# Patient Record
Sex: Female | Born: 1945 | Race: White | Hispanic: No | Marital: Single
Health system: Midwestern US, Academic
[De-identification: ages and names within clinical notes are randomized; demographics above are authoritative.]

---

## 2018-04-05 ENCOUNTER — Encounter: Admit: 2018-04-05 | Discharge: 2018-04-05 | Payer: MEDICARE

## 2018-04-05 DIAGNOSIS — L578 Other skin changes due to chronic exposure to nonionizing radiation: Secondary | ICD-10-CM

## 2018-04-05 MED ORDER — IMIQUIMOD 5 % TP CRPK
TOPICAL | 3 refills | Status: AC
Start: 2018-04-05 — End: ?

## 2018-04-06 ENCOUNTER — Ambulatory Visit: Admit: 2018-04-05 | Discharge: 2018-04-06 | Payer: MEDICARE

## 2018-04-06 DIAGNOSIS — D229 Melanocytic nevi, unspecified: Secondary | ICD-10-CM

## 2018-04-06 DIAGNOSIS — L570 Actinic keratosis: Principal | ICD-10-CM

## 2018-04-29 ENCOUNTER — Encounter: Admit: 2018-04-29 | Discharge: 2018-04-29 | Payer: MEDICARE

## 2018-04-29 ENCOUNTER — Ambulatory Visit: Admit: 2018-04-29 | Discharge: 2018-04-30 | Payer: MEDICARE

## 2018-04-29 DIAGNOSIS — R609 Edema, unspecified: Principal | ICD-10-CM

## 2018-08-09 ENCOUNTER — Encounter: Admit: 2018-08-09 | Discharge: 2018-08-09 | Payer: MEDICARE

## 2018-08-25 ENCOUNTER — Encounter: Admit: 2018-08-25 | Discharge: 2018-08-25 | Payer: MEDICARE

## 2020-01-01 IMAGING — CR UP_EXM
2 series · 2 of 2 positions shown · non-contrast
Comparison: none

[wrist pa]
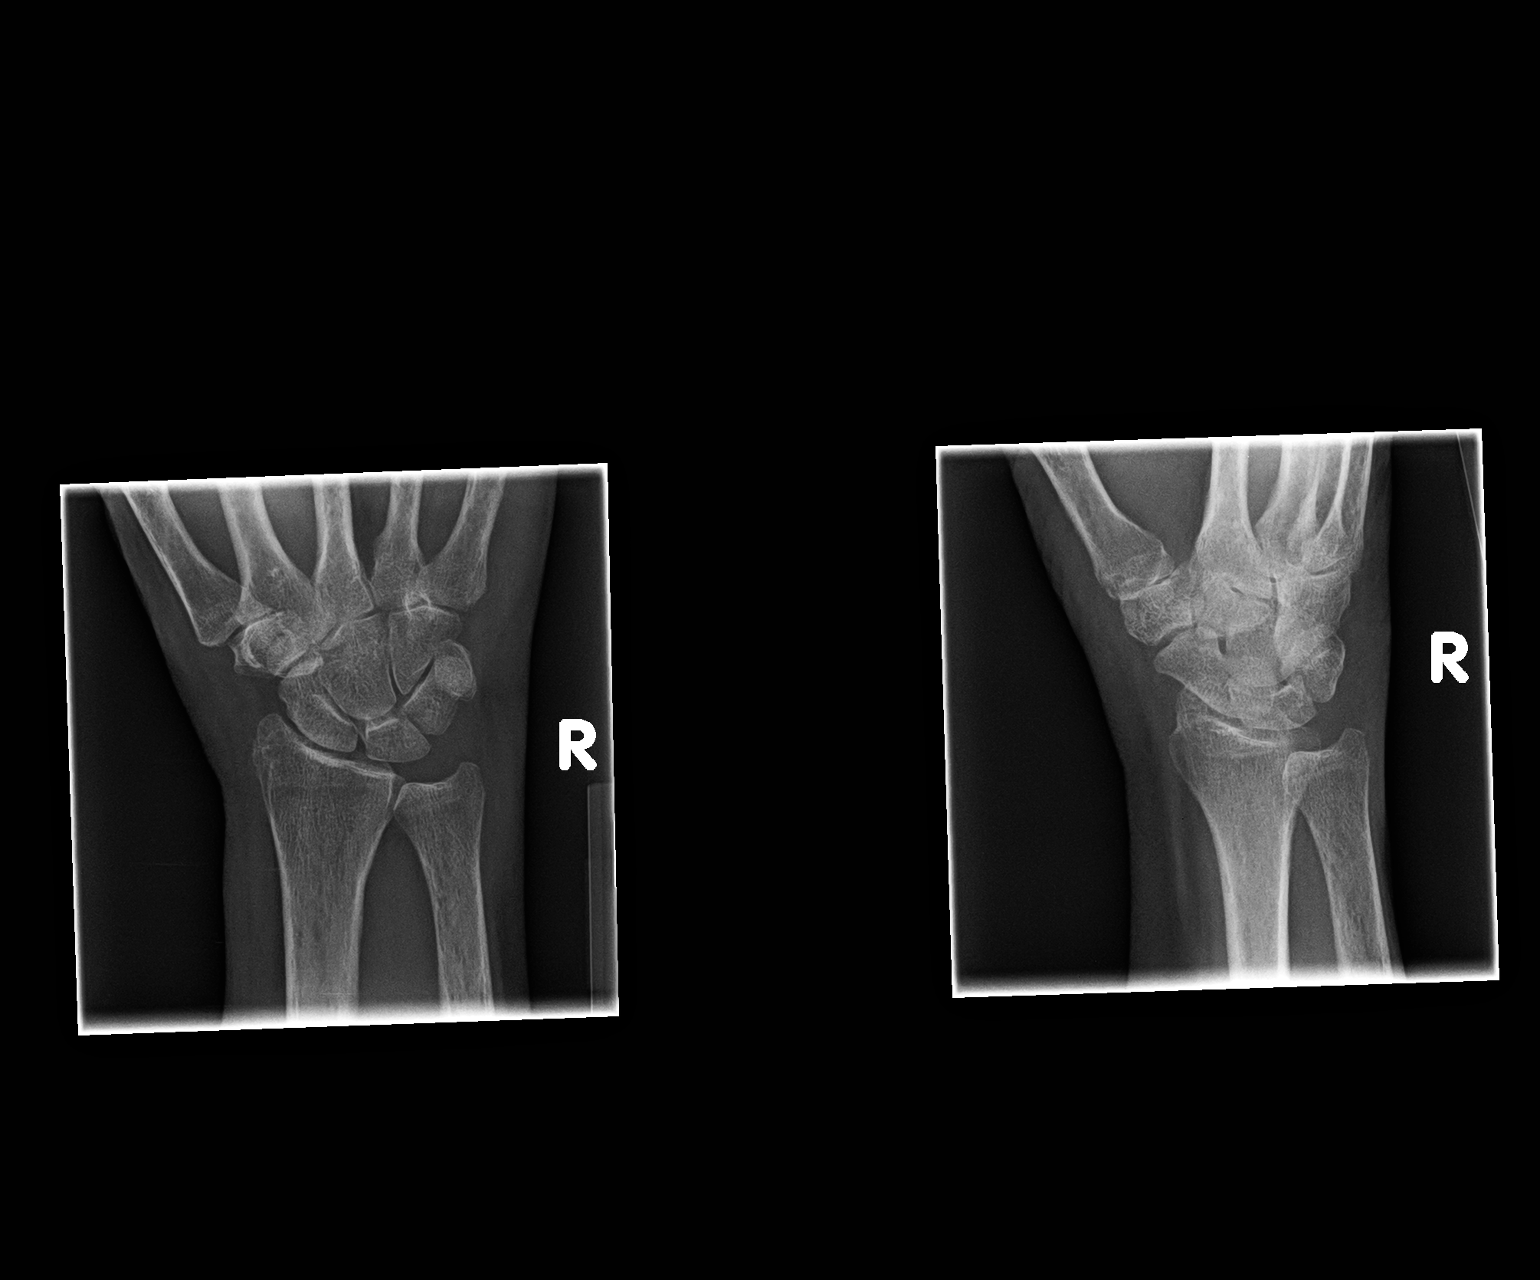

[wrist lat]
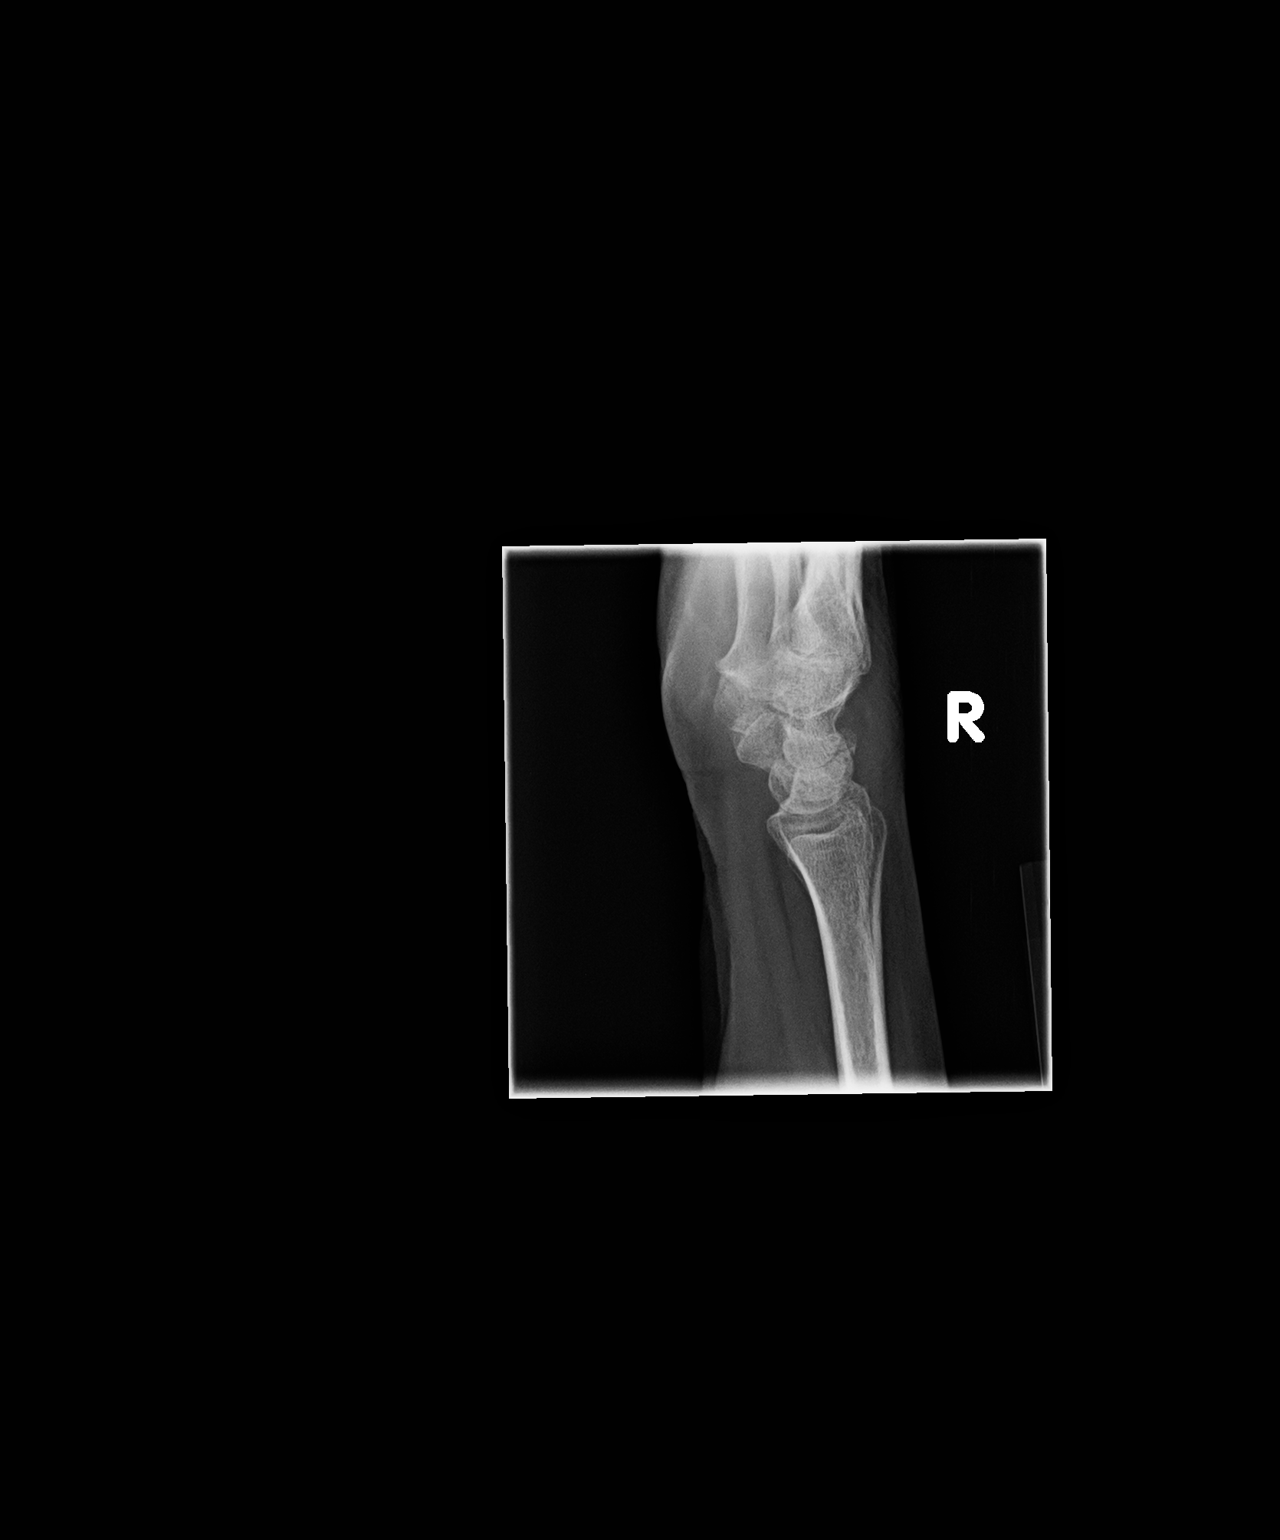

[2 of 2 positions shown; findings below may reference images not displayed]

EXAM

XR wrist RT min 3V

INDICATION

Wrist pain

TECHNIQUE

3 views of the right hand

COMPARISONS

None available at the time of dictation.

FINDINGS

No radiographic evidence of osseous malalignment or an aggressive focal osseous lesion.

There is an avulsion fracture at the dorsal aspect of the wrist compatible with a triquetral
avulsion fracture. Mild surrounding dorsal soft tissue swelling.

IMPRESSION
- Triquetral avulsion fracture.

Tech Notes:

Pt c/o pain, bruising,   swelling  on lateral and anterior sides. Fall injury this AM. JC/TJ

## 2020-09-06 ENCOUNTER — Encounter: Admit: 2020-09-06 | Discharge: 2020-09-06 | Payer: MEDICARE

## 2020-09-06 ENCOUNTER — Ambulatory Visit: Admit: 2020-09-06 | Discharge: 2020-09-07 | Payer: MEDICARE

## 2020-09-06 DIAGNOSIS — D489 Neoplasm of uncertain behavior, unspecified: Secondary | ICD-10-CM

## 2020-09-06 DIAGNOSIS — D229 Melanocytic nevi, unspecified: Secondary | ICD-10-CM

## 2020-09-06 MED ORDER — IMIQUIMOD 5 % TP CRPK
PACK | TOPICAL | 3 refills | Status: AC
Start: 2020-09-06 — End: ?

## 2020-09-06 NOTE — Progress Notes
ATTESTATION    I personally performed the key portions of the E/M visit, discussed case with resident and concur with resident documentation of history, physical exam, assessment, and treatment plan unless otherwise noted.    I performed the key components of the tangential (shave) biopsy or biopsies including site identification, discussion with patient, biopsy type and choice, and was present during the procedure.     Staff name:  Devona Konig, MD Date:  09/06/2020

## 2020-09-07 DIAGNOSIS — L570 Actinic keratosis: Secondary | ICD-10-CM

## 2020-11-28 ENCOUNTER — Encounter: Admit: 2020-11-28 | Discharge: 2020-11-28 | Payer: MEDICARE

## 2021-03-04 ENCOUNTER — Encounter: Admit: 2021-03-04 | Discharge: 2021-03-04 | Payer: MEDICARE

## 2021-03-04 ENCOUNTER — Ambulatory Visit: Admit: 2021-03-04 | Discharge: 2021-03-04 | Payer: MEDICARE

## 2021-03-04 DIAGNOSIS — L821 Other seborrheic keratosis: Secondary | ICD-10-CM

## 2021-03-04 DIAGNOSIS — L578 Other skin changes due to chronic exposure to nonionizing radiation: Secondary | ICD-10-CM

## 2021-03-04 DIAGNOSIS — L570 Actinic keratosis: Principal | ICD-10-CM

## 2021-03-04 DIAGNOSIS — D229 Melanocytic nevi, unspecified: Secondary | ICD-10-CM

## 2021-03-04 DIAGNOSIS — D1801 Hemangioma of skin and subcutaneous tissue: Secondary | ICD-10-CM

## 2021-03-04 MED ORDER — FLUOROURACIL 5 % TP CREA
Freq: Two times a day (BID) | TOPICAL | 3 refills | 24.00000 days | Status: AC
Start: 2021-03-04 — End: ?

## 2021-03-04 NOTE — Progress Notes
ATTESTATION    I personally performed the key portions of the E/M visit, discussed case with resident and concur with resident documentation of history, physical exam, assessment, and treatment plan unless otherwise noted.    I performed cryotherapy today: liquid nitrogen was applied for 10-12 seconds to the skin lesions and the expected blistering or scabbing reaction explained. Pt instructed not to pick at the area(s) and that hypopigmented scars may result from the procedure. Return if lesion fails to fully resolve. Patient tolerated procedure well, no complications.     Staff name:  Devona Konig, MD Date:  03/04/2021

## 2021-03-04 NOTE — Patient Instructions
Actinic Keratoses (AKs)    AKs are common skin growths. In fact, these growths are so common that the treatment for AKs is one of the most frequent reasons that people see a dermatologist.    Also known as ?solar keratoses? because they are caused by years of sun exposure  AKs are considered precancerous.  Left untreated, AKs may turn into squamous cell carcinoma.  AKs develop when UV light damages cells in the skin.  Cells damaged by UV light from the sun or indoor tanning cause the skin to become rough and scaly.      What do AKs look like?  Most AKs share common qualities such as being dry, scaly, and rough textured.  Not all AKs look the same.    Some are skin colored and may be easier to feel than see.  These AKs often feel like sandpaper, can appear in groups and cover larger areas of skin.    Others appear as red bumps; thick red scaly patches or growths; or crusted growths varying in color from red to brown to yellowish black.   Sometimes an AK grows rapidly upward, and you see a growth that that resembles the horn of an animal.  This is called a ?cutaneous horn?.  AKs often seem to disappear for weeks or months and then return.  This makes treatment important.  Left untreated, the damaged cells can continue to grow and skin cancer may develop.    Who gets AKs?  Fair skinned people have a higher risk of getting AKs  Also people with one or more of the following: Blonde or red hair; Blue, green or hazel eyes; Skin that freckles or burns when in the sun; 40 years or older.  People who have had a lot of sun exposure can develop AKs earlier than age 40.  Using a tanning bed or sun lamp also increases your risk.    Where do AKs form on the body?  When AKs develop, they tend to appear on skin that receives the most sun: face, forehead, and scalp, especially a bald scalp; Ears; neck and upper chest; back; arms and hands; lower legs, especially in women.  AKs also commonly form on or at the border of the lip.  An AK on the lip is known as ?actinic cheilitis? and looks like a scaly patch on a dry, often cracked lip.      Treatment  Cryosurgery/Freezing: This is the most common treatment for AKs.  Freezing the AK causes the skin to flake off.  New healthy skin forms as the treated area heals.  Chemotherapy for the skin: 5-fluorouracil is a cancer-fighting cream that you apply to the AK to destroy it.  Immunotherapy for the skin: Imiquimod cream and ingenol mebutate gel are medications that work with the body?s immune system to help destroy AKs.  NSAID for the skin: Sodium diclofenac gel is a medication that destroys AKs.  Photodynamic therapy: This treatment involves applying light-sensitive solution to the skin.  When the skin with the solution is exposed to a special light, this destroys the AK.  Chemical Peeling: A chemical solution is applied to the skin in order to peel away the AKs.  Laser skin resurfacing: A laser is used to remove the AKs.  Curretage:  This treatment involves the dermatologist carefully removing a visible AK with an instrument called a curette.  No single therapy works on all AKs or in all individuals.    How do I prevent   AKs?  Protecting your skin from the sun and other sources of UV light such as tanning beds is important.  This helps prevent new AKs and reduces the risk of AKs returning after treatment  Seek shade when appropriate, remembering that the sun?s rays are strongest between 10am and 2pm.   Wear protective clothing, such as a long-sleeved shirt, pants, a wide-brimmed hat and sunglasses, where possible.  Use a broad-spectrum sunscreen (both UVA and UVB protection) with an SPF of 30 or higher that offers water-resistance.   Reapply sunscreen approximately every 2 hours, after swimming or sweating, even on cloudy days.  Do not use tanning beds or other indoor tanning devices.  Check your skin often.  Contact us if you notice a growth on your skin that: starts to itch or bleed, becomes noticeably thicker, remains after treatment, changes in size, shape or color.

## 2021-06-21 ENCOUNTER — Encounter: Admit: 2021-06-21 | Discharge: 2021-06-21 | Payer: MEDICARE

## 2021-08-31 ENCOUNTER — Encounter: Admit: 2021-08-31 | Discharge: 2021-08-31 | Payer: MEDICARE

## 2021-08-31 ENCOUNTER — Ambulatory Visit: Admit: 2021-08-31 | Discharge: 2021-09-01 | Payer: MEDICARE

## 2021-08-31 DIAGNOSIS — D229 Melanocytic nevi, unspecified: Secondary | ICD-10-CM

## 2021-08-31 DIAGNOSIS — D1801 Hemangioma of skin and subcutaneous tissue: Secondary | ICD-10-CM

## 2021-08-31 DIAGNOSIS — L821 Other seborrheic keratosis: Secondary | ICD-10-CM

## 2021-08-31 DIAGNOSIS — L570 Actinic keratosis: Secondary | ICD-10-CM

## 2021-08-31 NOTE — Progress Notes
ATTESTATION    I personally performed the history and physical examination of the patient and discussed his management with the resident. I reviewed resident documentation of history, physical exam, assessment and treatment plan unless otherwise noted.     I performed cryotherapy today: liquid nitrogen was applied for 10-12 seconds to the skin lesions and the expected blistering or scabbing reaction explained. Pt instructed not to pick at the area(s) and that hypopigmented scars may result from the procedure. Return if lesion fails to fully resolve. Patient tolerated procedure well, no complications.         Staff name:  Devona Konig, MD Date:  08/31/2021

## 2022-09-11 ENCOUNTER — Encounter: Admit: 2022-09-11 | Discharge: 2022-09-11 | Payer: MEDICARE

## 2022-09-18 ENCOUNTER — Encounter: Admit: 2022-09-18 | Discharge: 2022-09-18 | Payer: MEDICARE

## 2022-11-30 ENCOUNTER — Encounter: Admit: 2022-11-30 | Discharge: 2022-11-30 | Payer: MEDICARE

## 2022-12-01 ENCOUNTER — Encounter: Admit: 2022-12-01 | Discharge: 2022-12-01 | Payer: MEDICARE

## 2022-12-04 ENCOUNTER — Encounter: Admit: 2022-12-04 | Discharge: 2022-12-04 | Payer: MEDICARE

## 2022-12-04 ENCOUNTER — Ambulatory Visit: Admit: 2022-12-04 | Discharge: 2022-12-05 | Payer: MEDICARE

## 2022-12-04 DIAGNOSIS — L821 Other seborrheic keratosis: Secondary | ICD-10-CM

## 2022-12-04 DIAGNOSIS — L309 Dermatitis, unspecified: Secondary | ICD-10-CM

## 2022-12-04 DIAGNOSIS — L578 Other skin changes due to chronic exposure to nonionizing radiation: Secondary | ICD-10-CM

## 2022-12-04 DIAGNOSIS — L570 Actinic keratosis: Secondary | ICD-10-CM

## 2022-12-04 DIAGNOSIS — D1801 Hemangioma of skin and subcutaneous tissue: Secondary | ICD-10-CM

## 2022-12-04 DIAGNOSIS — D229 Melanocytic nevi, unspecified: Secondary | ICD-10-CM

## 2022-12-04 MED ORDER — CLOBETASOL 0.05 % TP OINT
Freq: Two times a day (BID) | TOPICAL | 3 refills | Status: AC
Start: 2022-12-04 — End: ?

## 2022-12-04 NOTE — Progress Notes
ATTESTATION    I personally performed the history and physical examination of the patient and discussed his management with the resident. I reviewed resident documentation of history, physical exam, assessment and treatment plan unless otherwise noted.     I performed cryotherapy today: liquid nitrogen was applied for 10-12 seconds to the skin lesions and the expected blistering or scabbing reaction explained. Pt instructed not to pick at the area(s) and that hypopigmented scars may result from the procedure. Return if lesion fails to fully resolve. Patient tolerated procedure well, no complications.         Staff name:  Shanon Ace, MD Date:  12/04/2022

## 2023-11-30 ENCOUNTER — Encounter: Admit: 2023-11-30 | Discharge: 2023-11-30 | Payer: MEDICARE

## 2023-11-30 ENCOUNTER — Ambulatory Visit: Admit: 2023-11-30 | Discharge: 2023-12-01 | Payer: MEDICARE

## 2023-11-30 DIAGNOSIS — H61002 Unspecified perichondritis of left external ear: Secondary | ICD-10-CM

## 2023-11-30 DIAGNOSIS — D2261 Melanocytic nevi of right upper limb, including shoulder: Secondary | ICD-10-CM

## 2023-11-30 DIAGNOSIS — L309 Dermatitis, unspecified: Secondary | ICD-10-CM

## 2023-11-30 DIAGNOSIS — L821 Other seborrheic keratosis: Secondary | ICD-10-CM

## 2023-11-30 MED ORDER — CALCIPOTRIENE 0.005 % TP CREA
TOPICAL | 1 refills | Status: AC
Start: 2023-11-30 — End: ?

## 2023-11-30 NOTE — Progress Notes
 ATTESTATION    I personally performed the key portions of the E/M visit, discussed case with resident and concur with resident documentation of history, physical exam, assessment, and treatment plan unless otherwise noted.    Staff name:  Lorelle DELENA Hind, MD Date:  11/30/2023

## 2023-12-01 DIAGNOSIS — L570 Actinic keratosis: Principal | ICD-10-CM
